# Patient Record
Sex: Female | Born: 1955 | Race: White | Hispanic: No | Marital: Married | Smoking: Never smoker
Health system: Southern US, Community
[De-identification: ages and names within clinical notes are randomized; demographics above are authoritative.]

## PROBLEM LIST (undated history)

## (undated) HISTORY — PX: CERVICAL SPINE SURGERY: SHX589

## (undated) HISTORY — PX: JOINT REPLACEMENT: SHX530

---

## 2015-03-22 ENCOUNTER — Emergency Department (HOSPITAL_COMMUNITY): Payer: Managed Care, Other (non HMO)

## 2015-03-22 ENCOUNTER — Encounter (HOSPITAL_COMMUNITY): Payer: Self-pay | Admitting: Emergency Medicine

## 2015-03-22 ENCOUNTER — Emergency Department (HOSPITAL_COMMUNITY)
Admission: EM | Admit: 2015-03-22 | Discharge: 2015-03-22 | Disposition: A | Discharge status: Home / Self Care | Payer: Managed Care, Other (non HMO) | Attending: Emergency Medicine | Admitting: Emergency Medicine

## 2015-03-22 DIAGNOSIS — R079 Chest pain, unspecified: Secondary | ICD-10-CM | POA: Diagnosis not present

## 2015-03-22 DIAGNOSIS — R000 Tachycardia, unspecified: Secondary | ICD-10-CM | POA: Insufficient documentation

## 2015-03-22 DIAGNOSIS — R0602 Shortness of breath: Secondary | ICD-10-CM

## 2015-03-22 DIAGNOSIS — Z880 Allergy status to penicillin: Secondary | ICD-10-CM | POA: Insufficient documentation

## 2015-03-22 LAB — I-STAT TROPONIN, ED
TROPONIN I, POC: 0 ng/mL (ref 0.00–0.08)
Troponin i, poc: 0 ng/mL (ref 0.00–0.08)

## 2015-03-22 LAB — CBC WITH DIFFERENTIAL/PLATELET
BASOS ABS: 0 10*3/uL (ref 0.0–0.1)
Basophils Relative: 0 % (ref 0–1)
EOS PCT: 3 % (ref 0–5)
Eosinophils Absolute: 0.2 10*3/uL (ref 0.0–0.7)
HEMATOCRIT: 43.7 % (ref 36.0–46.0)
HEMOGLOBIN: 14.7 g/dL (ref 12.0–15.0)
LYMPHS ABS: 1.8 10*3/uL (ref 0.7–4.0)
LYMPHS PCT: 25 % (ref 12–46)
MCH: 32 pg (ref 26.0–34.0)
MCHC: 33.6 g/dL (ref 30.0–36.0)
MCV: 95.2 fL (ref 78.0–100.0)
MONO ABS: 0.5 10*3/uL (ref 0.1–1.0)
Monocytes Relative: 7 % (ref 3–12)
Neutro Abs: 4.8 10*3/uL (ref 1.7–7.7)
Neutrophils Relative %: 65 % (ref 43–77)
Platelets: 271 10*3/uL (ref 150–400)
RBC: 4.59 MIL/uL (ref 3.87–5.11)
RDW: 13.2 % (ref 11.5–15.5)
WBC: 7.4 10*3/uL (ref 4.0–10.5)

## 2015-03-22 LAB — BASIC METABOLIC PANEL
Anion gap: 9 (ref 5–15)
BUN: 12 mg/dL (ref 6–20)
CHLORIDE: 98 mmol/L — AB (ref 101–111)
CO2: 28 mmol/L (ref 22–32)
CREATININE: 0.8 mg/dL (ref 0.44–1.00)
Calcium: 9.9 mg/dL (ref 8.9–10.3)
GFR calc Af Amer: >60 mL/min (ref >60)
GLUCOSE: 143 mg/dL — AB (ref 65–99)
POTASSIUM: 4.1 mmol/L (ref 3.5–5.1)
Sodium: 135 mmol/L (ref 135–145)

## 2015-03-22 LAB — BRAIN NATRIURETIC PEPTIDE: B NATRIURETIC PEPTIDE 5: 12.5 pg/mL (ref 0.0–100.0)

## 2015-03-22 LAB — I-STAT CG4 LACTIC ACID, ED
LACTIC ACID, VENOUS: 1.95 mmol/L (ref 0.5–2.0)
LACTIC ACID, VENOUS: 1.21 mmol/L (ref 0.5–2.0)

## 2015-03-22 LAB — TROPONIN I: Troponin I: <0.03 ng/mL (ref <0.031)

## 2015-03-22 MED ORDER — OXYCODONE-ACETAMINOPHEN 5-325 MG PO TABS
1.0000 | ORAL_TABLET | Freq: Once | ORAL | Status: AC
  Administered 2015-03-22: 1 via ORAL
  Filled 2015-03-22: qty 1

## 2015-03-22 MED ORDER — IPRATROPIUM-ALBUTEROL 0.5-2.5 (3) MG/3ML IN SOLN
3.0000 mL | RESPIRATORY_TRACT | Status: AC
  Administered 2015-03-22: 3 mL via RESPIRATORY_TRACT
  Filled 2015-03-22: qty 3

## 2015-03-22 MED ORDER — PREDNISONE 20 MG PO TABS
40.0000 mg | ORAL_TABLET | Freq: Once | ORAL | Status: AC
  Administered 2015-03-22: 40 mg via ORAL
  Filled 2015-03-22: qty 2

## 2015-03-22 MED ORDER — PREDNISONE 20 MG PO TABS: 40.0000 mg | ORAL_TABLET | Freq: Every day | ORAL | Status: AC

## 2015-03-22 MED ORDER — IOHEXOL 350 MG/ML SOLN
100.0000 mL | Freq: Once | INTRAVENOUS | Status: AC | PRN
  Administered 2015-03-22: 100 mL via INTRAVENOUS

## 2015-03-22 MED ORDER — ALBUTEROL SULFATE HFA 108 (90 BASE) MCG/ACT IN AERS: 2.0000 | INHALATION_SPRAY | Freq: Four times a day (QID) | RESPIRATORY_TRACT | Status: AC | PRN

## 2015-03-22 MED ORDER — SODIUM CHLORIDE 0.9 % IV BOLUS (SEPSIS)
1000.0000 mL | Freq: Once | INTRAVENOUS | Status: AC
  Administered 2015-03-22: 1000 mL via INTRAVENOUS

## 2015-03-22 MED ORDER — ALBUTEROL SULFATE HFA 108 (90 BASE) MCG/ACT IN AERS
2.0000 | INHALATION_SPRAY | Freq: Once | RESPIRATORY_TRACT | Status: AC
  Administered 2015-03-22: 2 via RESPIRATORY_TRACT
  Filled 2015-03-22: qty 6.7

## 2015-03-22 NOTE — ED Provider Notes (Addendum)
CSN: [id number]     Arrival date & time 03/22/15  1109 History   First MD Initiated Contact with Patient 03/22/15 1203     Chief Complaint  Patient presents with  . Shortness of Breath  . Chest Pain     (Consider location/radiation/quality/duration/timing/severity/associated sxs/prior Treatment) Patient is a 59 y.o. female presenting with shortness of breath.  Shortness of Breath Severity:  Moderate Onset quality:  Gradual Duration:  2 days Timing:  Intermittent Progression:  Partially resolved Chronicity:  New Context: not activity and not emotional upset   Relieved by:  None tried Worsened by:  Nothing tried Ineffective treatments:  None tried Associated symptoms: chest pain   Associated symptoms: no abdominal pain, no cough and no fever     History reviewed. No pertinent past medical history. Past Surgical History  Procedure Laterality Date  . Joint replacement    . Cervical spine surgery     History reviewed. No pertinent family history. Social History  Substance Use Topics  . Smoking status: Never Smoker   . Smokeless tobacco: None  . Alcohol Use: Yes     Comment: occ   OB History    No data available     Review of Systems  Constitutional: Negative for fever, chills and fatigue.  Respiratory: Positive for shortness of breath. Negative for cough.   Cardiovascular: Positive for chest pain.  Gastrointestinal: Negative for abdominal pain.  All other systems reviewed and are negative.     Allergies  Penicillins  Home Medications   Prior to Admission medications   Not on File   BP 119/79 mmHg  Pulse 130  Temp(Src) 98.3 F (36.8 C) (Oral)  Resp 20  SpO2 90% Physical Exam  Constitutional: She is oriented to person, place, and time. She appears well-developed and well-nourished.  HENT:  Head: Normocephalic and atraumatic.  Eyes: Conjunctivae and EOM are normal. Right eye exhibits no discharge. Left eye exhibits no discharge.  Cardiovascular:  Regular rhythm.  Tachycardia present.   Pulmonary/Chest: Effort normal. Tachypnea noted. No respiratory distress. She has decreased breath sounds. She has no wheezes. She has no rhonchi. She has no rales.  Abdominal: Soft. She exhibits no distension. There is no tenderness. There is no rebound.  Musculoskeletal: Normal range of motion. She exhibits no edema or tenderness.  Neurological: She is alert and oriented to person, place, and time.  Skin: Skin is warm and dry.  Nursing note and vitals reviewed.   ED Course  Procedures (including critical care time)  CRITICAL CARE Performed by: Marily Memos   Total critical care time: 35 minutes  Critical care time was exclusive of separately billable procedures and treating other patients.  Critical care was necessary to treat or prevent imminent or life-threatening deterioration.  Critical care was time spent personally by me on the following activities: development of treatment plan with patient and/or surrogate as well as nursing, discussions with consultants, evaluation of patient's response to treatment, examination of patient, obtaining history from patient or surrogate, ordering and performing treatments and interventions, ordering and review of laboratory studies, ordering and review of radiographic studies, pulse oximetry and re-evaluation of patient's condition.     EMERGENCY DEPARTMENT [place] CARDIAC EXAM "Study: Limited Ultrasound of the heart and pericardium"  INDICATIONS:Tachycardia and Dyspnea Multiple views of the heart and pericardium were obtained in real-time with a multi-frequency probe.  PERFORMED VH:QIONGE  IMAGES ARCHIVED?: Yes  FINDINGS: No pericardial effusion, Hyperdynamic contractility, IVC flat and Tamponade physiology absent  LIMITATIONS:  Body habitus  VIEWS USED: Subcostal 4 chamber, Parasternal long axis, Apical 4 chamber  and Inferior Vena Cava  INTERPRETATION: Cardiac activity present, Pericardial  effusioin absent, Cardiac tamponade absent, Probable low CVP and Increased contractility  CPT Code: [phone] (limited transthoracic cardiac)   Labs Review Labs Reviewed  BASIC METABOLIC PANEL - Abnormal; Notable for the following:    Chloride 98 (*)    Glucose, Bld 143 (*)    All other components within normal limits  CBC WITH DIFFERENTIAL/PLATELET  BRAIN NATRIURETIC PEPTIDE  TROPONIN I  I-STAT TROPOININ, ED  I-STAT CG4 LACTIC ACID, ED    Imaging Review Dg Chest Portable 1 View  03/22/2015   CLINICAL DATA:  Chest pain and shortness of breath since last evening.  EXAM: PORTABLE CHEST - 1 VIEW  COMPARISON:  None.  FINDINGS: The cardiac silhouette, mediastinal and hilar contours are within normal limits. There is mild tortuosity and calcification of the thoracic aorta. Streaky bibasilar density could reflect atelectasis or scar. No obvious focal airspace consolidation or pleural effusion. Emphysematous changes noted with right upper lobe bulla. The bony thorax is intact.  IMPRESSION: Emphysematous changes.  Bibasilar atelectasis or scarring.   Electronically Signed   By: Rudie Meyer M.D.   On: 03/22/2015 12:42   I have personally reviewed and evaluated these images and lab results as part of my medical decision-making.   EKG Interpretation   Date/Time:  Monday March 22 2015 11:36:51 EDT Ventricular Rate:  132 PR Interval:  176 QRS Duration: 82 QT Interval:  274 QTC Calculation: 405 R Axis:   122 Text Interpretation:  Sinus tachycardia Left posterior fascicular block  Anterior infarct , age undetermined Abnormal ECG Confirmed by [facility] MD,  Barbara Cower [phone]) on 03/22/2015 12:32:57 PM      MDM   Final diagnoses:  Chest pain  SOB (shortness of breath)  hypoxia Tachycardia tachypnea  59 yo F w/ recent surgery here with dyspnea, CP, tachycardia, tachypnea and intermittent hypoxia, lungs with slightly diminshed BS but overall clear. Initially concerned for PE, PNA, CHF v  COPD/asthma exacerbation. Started on fluids, supplemental oxygen, IV access aobtained, cardiac adn oxygen monitor applied. To rule out PE, CT ordered and negative for PE. Reviewed films directly with radiologist and no PE or infectious process noted. No e/o CHF either. To evaluate for possible bronchoconstriction, patient was given some duo neb treatments with moderate decrease in symptoms. Able to ambulate without dyspnea or deoxygenation afterwards. US done as above with IVC  Flattening c/w dehydration (consistent with heart rate improvement with fluid challenge). Low risk for ACS however delta troponins done for rule out.  Possibly dehydrated with some COPD exacerbation, will treat accordingly with strict return precautions.   I have personally and contemperaneously reviewed labs and imaging and used in my decision making as above.   A medical screening exam was performed and I feel the patient has had an appropriate workup for their chief complaint at this time and likelihood of emergent condition existing is low. They have been counseled on decision, discharge, follow up and which symptoms necessitate immediate return to the emergency department. They or their family verbally stated understanding and agreement with plan and discharged in stable condition.     Marily Memos, MD 03/25/15 1535

## 2015-03-22 NOTE — ED Notes (Signed)
Pt sts increased SOB and right sided CP; pt sts recent neck sx; pt sts unable to lay flat due to SOB

## 2016-05-27 IMAGING — CT CT ANGIO CHEST
2 of 9 series · 18 of 46 positions shown · IV contrast (Omni 300)
Comparison: Chest x-ray March 22, 19

CLINICAL DATA: Shortness of breath and right chest pain. Patient
had recent neck surgery for herniated disc.

EXAM:
CT ANGIOGRAPHY CHEST WITH CONTRAST
TECHNIQUE: Multidetector CT imaging of the chest was performed using the
standard protocol during bolus administration of intravenous
contrast. Multiplanar CT image reconstructions and MIPs were
obtained to evaluate the vascular anatomy.
CONTRAST:  100mL OMNIPAQUE IOHEXOL 350 MG/ML SOLN

[Series 5: thins · axial · 0.76mm/px · z∈[+1124,+1380]mm · 15 of 288 slices shown]
[im 16/288  lung]
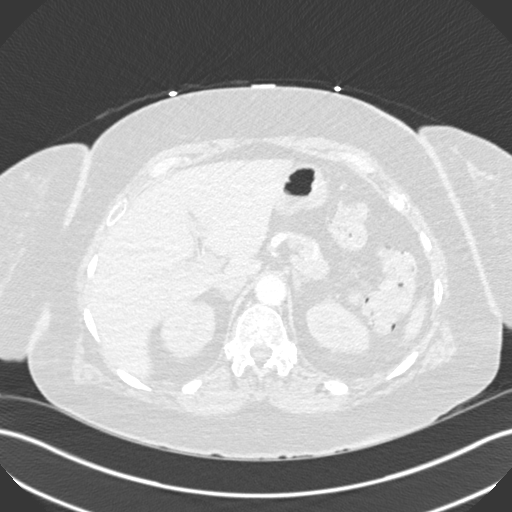
[im 32/288  soft-tissue]
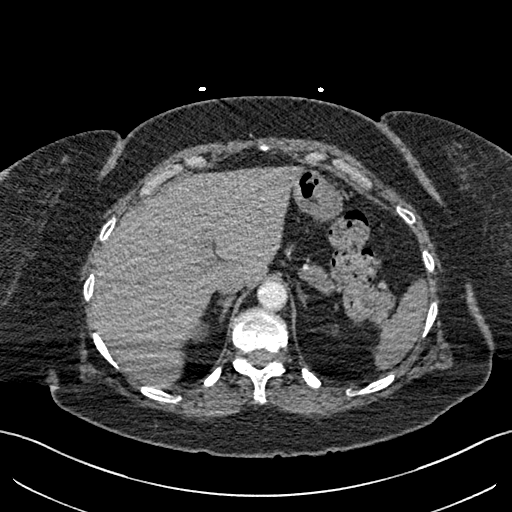
[im 48/288  lung]
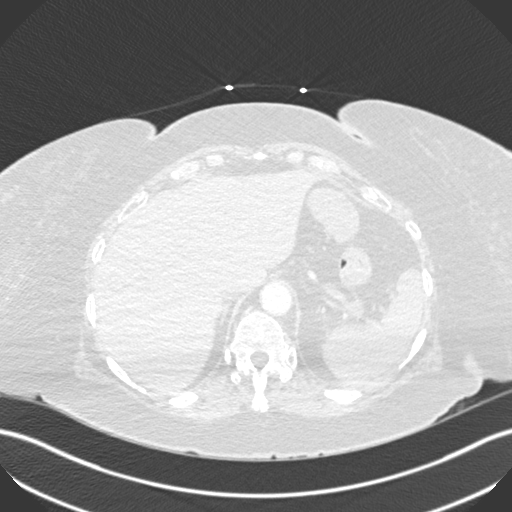
[im 64/288  soft-tissue]
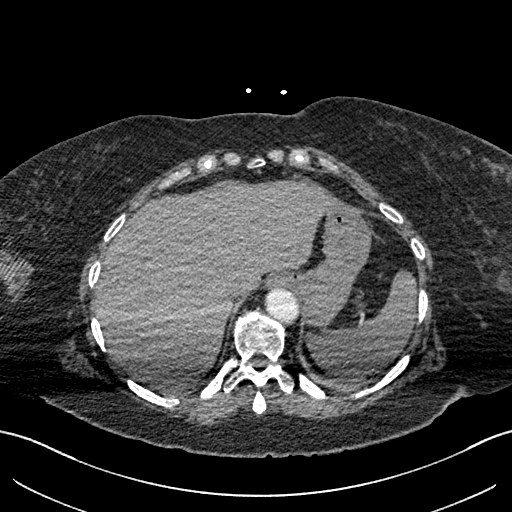
[im 96/288  lung]
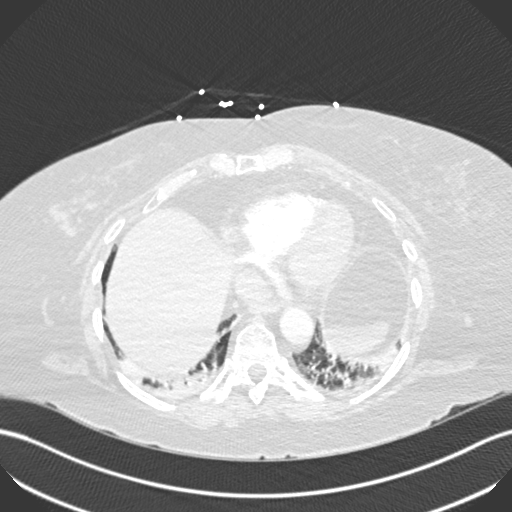
[im 112/288  soft-tissue]
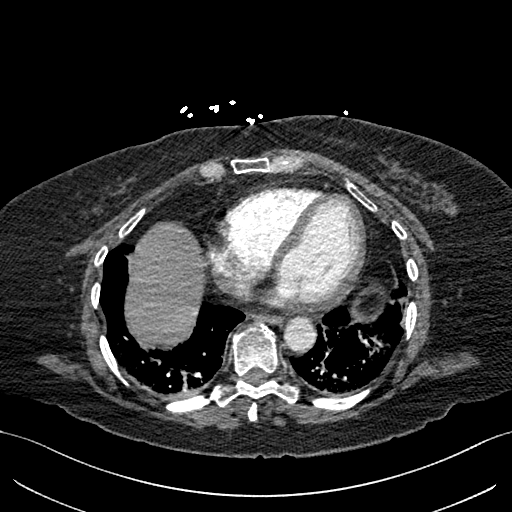
[im 128/288  lung]
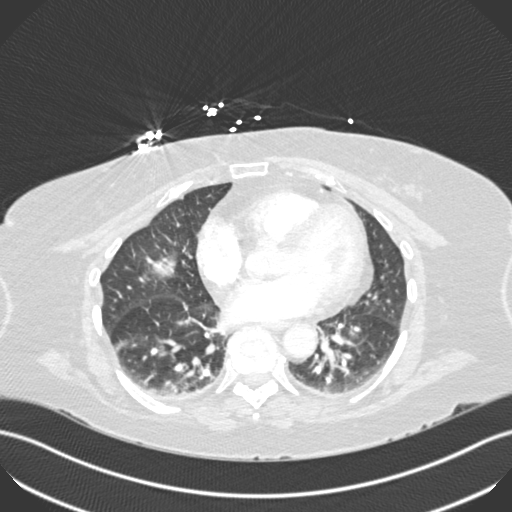
[im 144/288  soft-tissue]
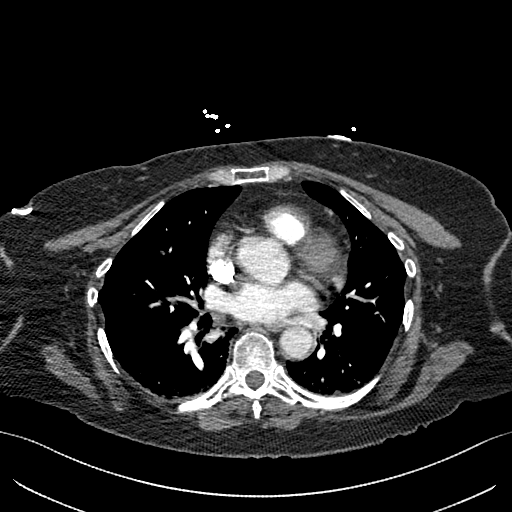
[im 160/288  lung]
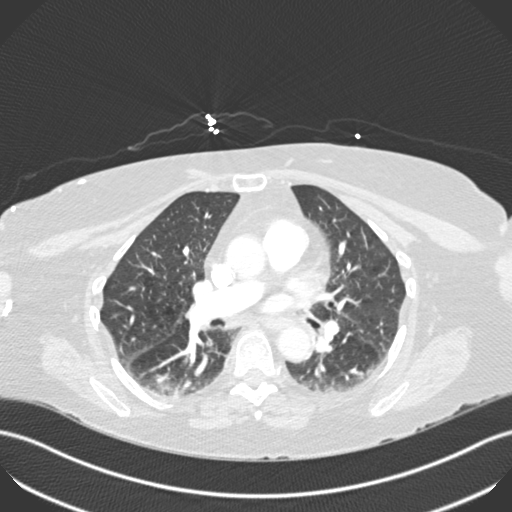
[im 176/288  soft-tissue]
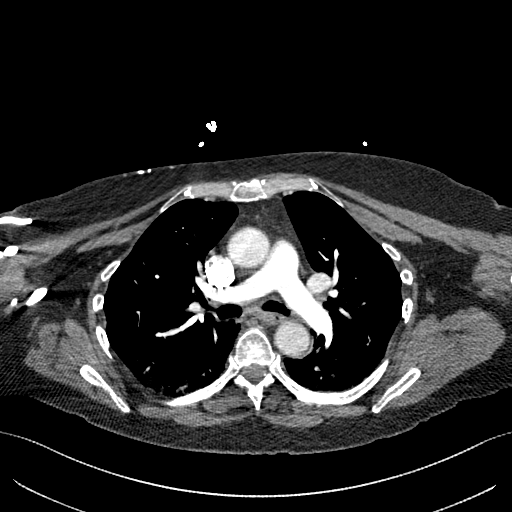
[im 192/288  lung]
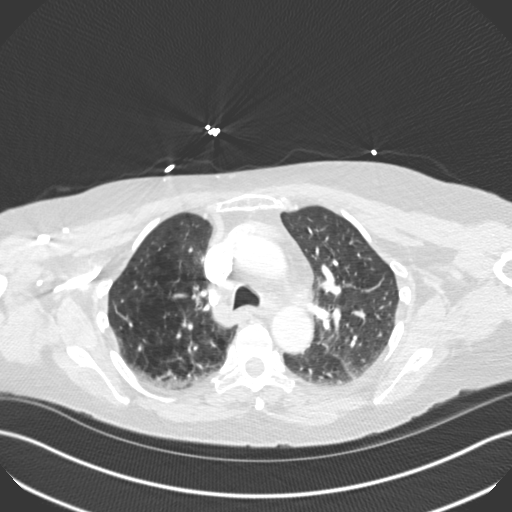
[im 224/288  soft-tissue]
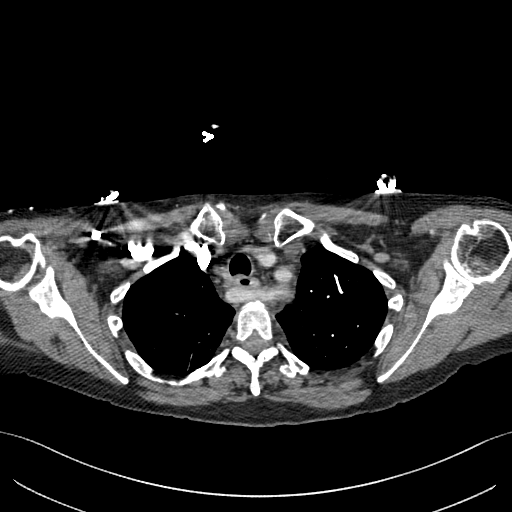
[im 240/288  lung]
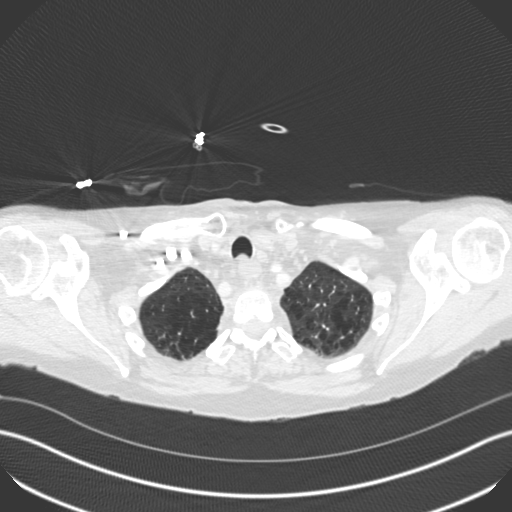
[im 256/288  soft-tissue]
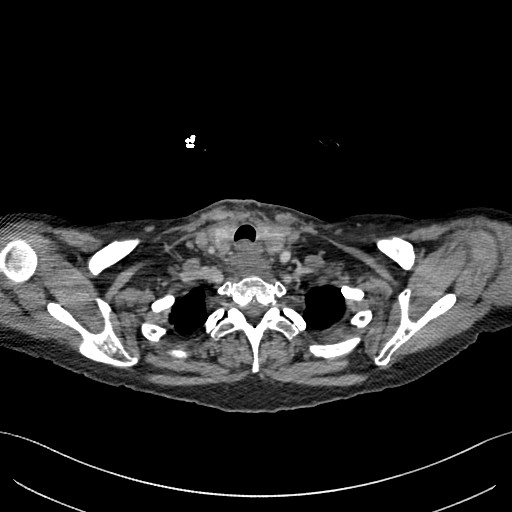
[im 272/288  lung]
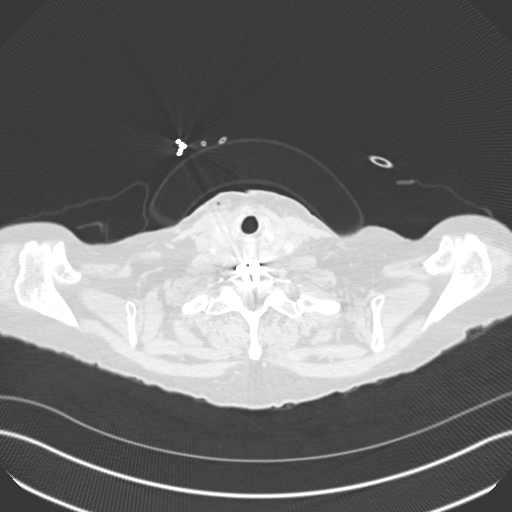

[Series 7: coronal mpr · coronal · 0.59mm/px · 3 of 112 slices shown]
[im 28/112  soft-tissue]
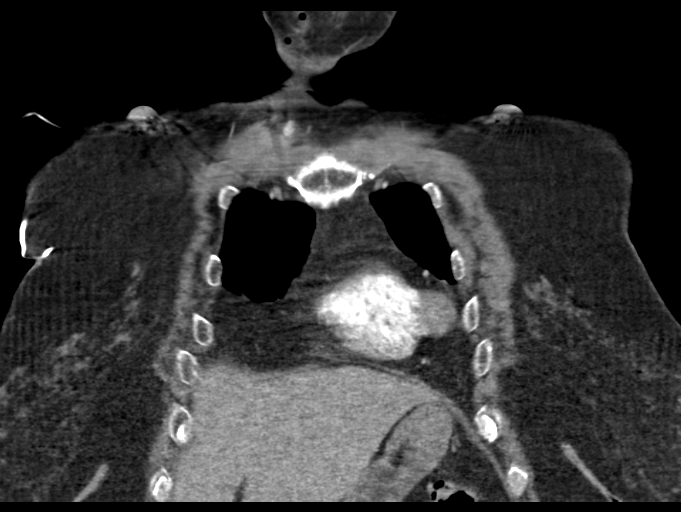
[im 56/112  soft-tissue]
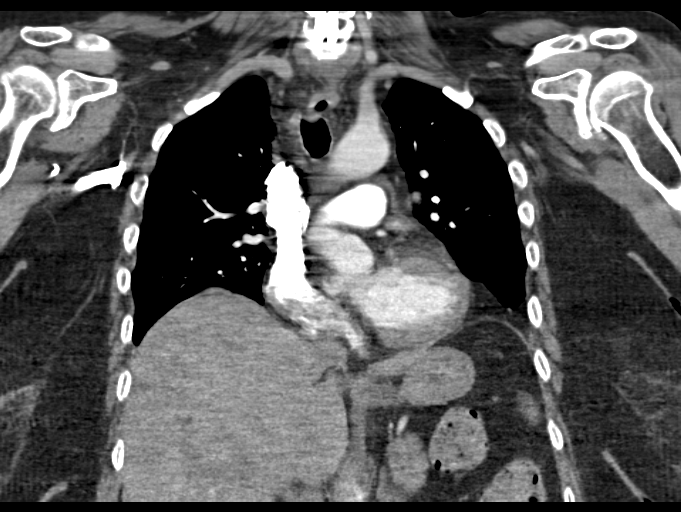
[im 84/112  soft-tissue]
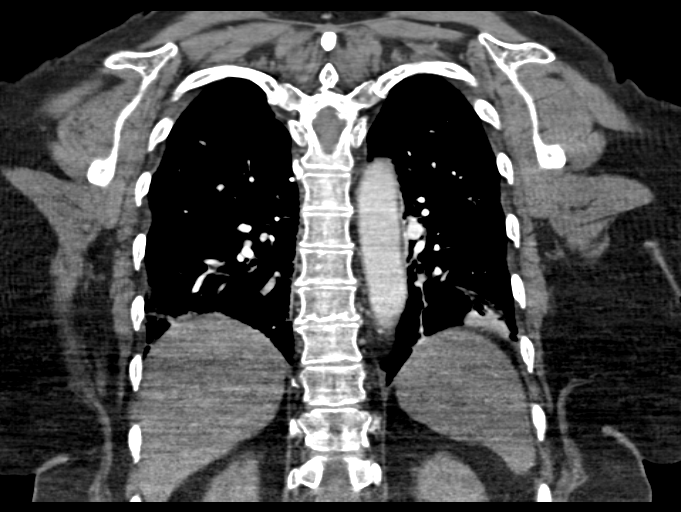

[18 of 46 positions shown; findings below may reference images not displayed]

FINDINGS: There is no pulmonary embolus. The aorta demonstrates
atherosclerosis without aneurysm. There is no mediastinal or hilar
lymphadenopathy. The heart size is normal. Images of the lungs
demonstrate no focal pneumonia or pleural effusion. There is
atelectasis of the posterior lung bases. The visualized upper
abdominal structures are normal. Patient is status post prior
anterior fusion of the mid cervical spine. Degenerative joint
changes of thoracic spine are noted.

Review of the MIP images confirms the above findings.
IMPRESSION: No pulmonary embolus.

Mild atelectasis of the posterior bilateral lung bases.

## 2016-05-27 IMAGING — CR DG CHEST 1V PORT
1 series · 1 of 1 positions shown · non-contrast
Comparison: None.

CLINICAL DATA: Chest pain and shortness of breath since last
evening.

EXAM:
PORTABLE CHEST - 1 VIEW

[AP]
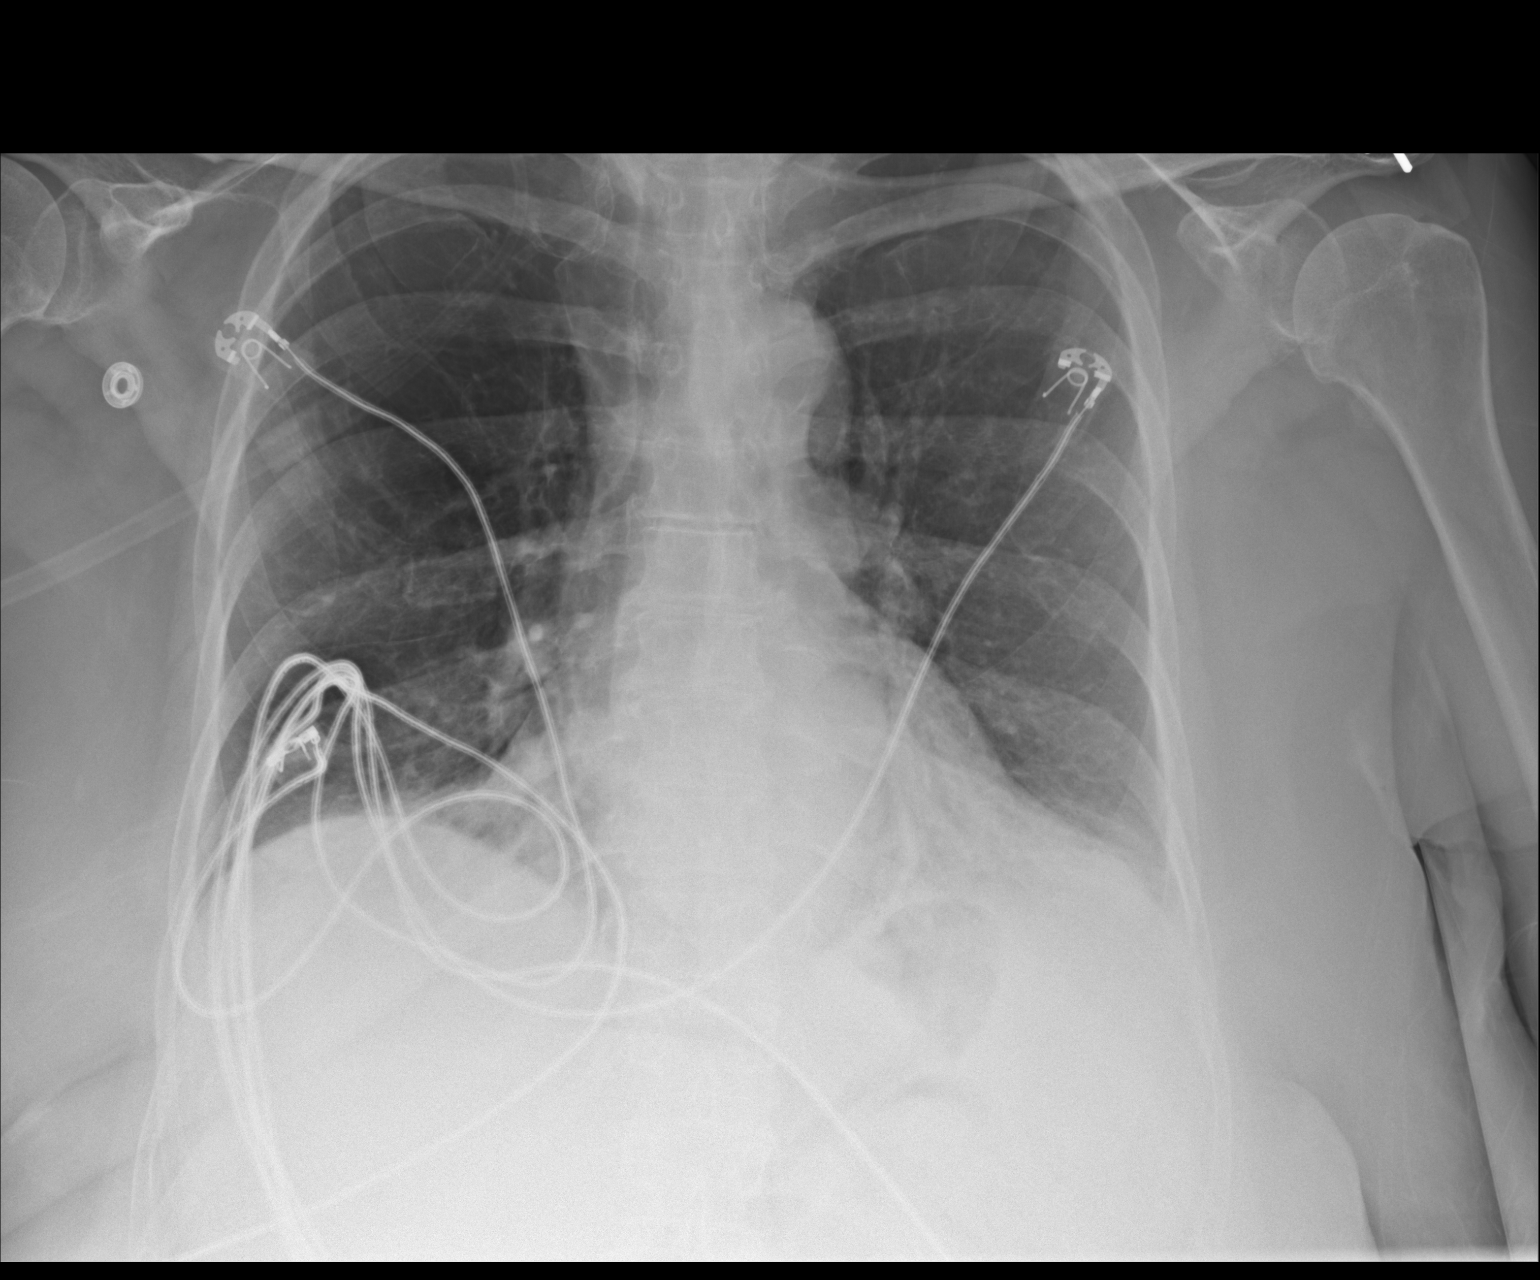

[1 of 1 positions shown; findings below may reference images not displayed]

FINDINGS: The cardiac silhouette, mediastinal and hilar contours are within
normal limits. There is mild tortuosity and calcification of the
thoracic aorta. Streaky bibasilar density could reflect atelectasis
or scar. No obvious focal airspace consolidation or pleural
effusion. Emphysematous changes noted with right upper lobe bulla.
The bony thorax is intact.
IMPRESSION: Emphysematous changes.

Bibasilar atelectasis or scarring.
